# Patient Record
Sex: Male | Born: 1935 | Race: White | Hispanic: No | Marital: Married
Health system: Southern US, Community
[De-identification: ages and names within clinical notes are randomized; demographics above are authoritative.]

---

## 2007-01-16 ENCOUNTER — Telehealth: Payer: Self-pay | Admitting: Infectious Disease

## 2007-01-21 ENCOUNTER — Ambulatory Visit (HOSPITAL_COMMUNITY): Admission: RE | Admit: 2007-01-21 | Discharge: 2007-01-21 | Payer: Self-pay | Admitting: Infectious Disease

## 2007-01-21 ENCOUNTER — Ambulatory Visit: Payer: Self-pay | Admitting: Infectious Disease

## 2007-01-21 DIAGNOSIS — IMO0001 Reserved for inherently not codable concepts without codable children: Secondary | ICD-10-CM

## 2007-01-21 DIAGNOSIS — I4891 Unspecified atrial fibrillation: Secondary | ICD-10-CM

## 2007-01-21 DIAGNOSIS — B957 Other staphylococcus as the cause of diseases classified elsewhere: Secondary | ICD-10-CM

## 2007-01-21 DIAGNOSIS — I509 Heart failure, unspecified: Secondary | ICD-10-CM | POA: Insufficient documentation

## 2007-01-21 DIAGNOSIS — Z951 Presence of aortocoronary bypass graft: Secondary | ICD-10-CM

## 2007-01-21 DIAGNOSIS — R5383 Other fatigue: Secondary | ICD-10-CM

## 2007-01-21 DIAGNOSIS — G909 Disorder of the autonomic nervous system, unspecified: Secondary | ICD-10-CM | POA: Insufficient documentation

## 2007-01-21 DIAGNOSIS — R5381 Other malaise: Secondary | ICD-10-CM

## 2007-01-21 DIAGNOSIS — Z950 Presence of cardiac pacemaker: Secondary | ICD-10-CM

## 2007-01-21 LAB — CONVERTED CEMR LAB
ALT: 28 units/L (ref 0–53)
AST: 23 units/L (ref 0–37)
Albumin ELP: 58.6 % (ref 55.8–66.1)
Albumin: 4.3 g/dL (ref 3.5–5.2)
Aldolase: 5.5 units/L (ref <=8.1)
Alkaline Phosphatase: 83 units/L (ref 39–117)
Alpha-1-Globulin: 4.6 % (ref 2.9–4.9)
Alpha-2-Globulin: 11 % (ref 7.1–11.8)
BUN: 20 mg/dL (ref 6–23)
Beta Globulin: 6.3 % (ref 4.7–7.2)
Bilirubin Urine: NEGATIVE
CMV IgM: <0.9 (ref <0.90)
CO2: 23 meq/L (ref 19–32)
CRP: 0.6 mg/dL — ABNORMAL HIGH (ref <0.6)
Calcium: 10.1 mg/dL (ref 8.4–10.5)
Chloride: 105 meq/L (ref 96–112)
Creatinine, Ser: 1.03 mg/dL (ref 0.40–1.50)
Cytomegalovirus Ab-IgG: POSITIVE — AB
EBV NA IgG: 4.97 — ABNORMAL HIGH
EBV VCA IgG: 4.82 — ABNORMAL HIGH
EBV VCA IgM: 0.17
Ferritin: 306 ng/mL (ref 22–322)
Free T4: 1.1 ng/dL (ref 0.89–1.80)
Gamma Globulin: 14.7 % (ref 11.1–18.8)
Glucose, Bld: 92 mg/dL (ref 70–99)
HCV Ab: NEGATIVE
Hemoglobin, Urine: NEGATIVE
Hep A Total Ab: POSITIVE — AB
Hep B S Ab: NEGATIVE
Hepatitis B Surface Ag: NEGATIVE
Hgb A1c MFr Bld: 5.5 %
Iron: 61 ug/dL (ref 42–165)
Ketones, ur: NEGATIVE mg/dL
LDH: 170 units/L (ref 94–250)
Leukocytes, UA: NEGATIVE
Neutrophil cytoplasmic antibodies,IgG,serum: <1:20 {titer}
Nitrite: NEGATIVE
PSA: 0.67 ng/mL (ref 0.10–4.00)
Potassium: 4.2 meq/L (ref 3.5–5.3)
Pro B Natriuretic peptide (BNP): <30 pg/mL (ref 0.0–100.0)
Pro B Natriuretic peptide (BNP): 26 pg/mL (ref 0.0–100.0)
Protein, ur: NEGATIVE mg/dL
RBC Folate: 568 ng/mL (ref 180–600)
Saturation Ratios: 21 % (ref 20–55)
Sed Rate: 20 mm/hr — ABNORMAL HIGH (ref 0–16)
Sed Rate: 20 mm/hr — ABNORMAL HIGH (ref 0–16)
Sodium: 140 meq/L (ref 135–145)
Specific Gravity, Urine: 1.025 (ref 1.005–1.03)
T3, Total: 140.1 ng/dL (ref 80.0–204.0)
TIBC: 290 ug/dL (ref 215–435)
TSH: 2.118 microintl units/mL (ref 0.350–5.50)
Total Bilirubin: 0.4 mg/dL (ref 0.3–1.2)
Total CK: 100 units/L (ref 7–232)
Total Protein, Serum Electrophoresis: 7.2 g/dL (ref 6.0–8.3)
Total Protein: 7.2 g/dL (ref 6.0–8.3)
UIBC: 229 ug/dL
Uric Acid, Serum: 7 mg/dL (ref 2.4–7.0)
Urine Glucose: NEGATIVE mg/dL
Urobilinogen, UA: 0.2 (ref 0.0–1.0)
Vitamin B-12: 436 pg/mL (ref 211–911)
pH: 5.5 (ref 5.0–8.0)

## 2007-01-22 ENCOUNTER — Encounter: Payer: Self-pay | Admitting: Infectious Disease

## 2007-01-24 ENCOUNTER — Encounter: Payer: Self-pay | Admitting: Infectious Disease

## 2007-01-24 ENCOUNTER — Telehealth: Payer: Self-pay | Admitting: Infectious Disease

## 2007-01-24 ENCOUNTER — Telehealth: Payer: Self-pay

## 2007-01-29 ENCOUNTER — Encounter: Payer: Self-pay | Admitting: *Deleted

## 2007-01-29 ENCOUNTER — Encounter: Payer: Self-pay | Admitting: Infectious Disease

## 2007-01-29 ENCOUNTER — Ambulatory Visit (HOSPITAL_COMMUNITY): Admission: RE | Admit: 2007-01-29 | Discharge: 2007-01-29 | Payer: Self-pay | Admitting: Infectious Disease

## 2007-01-29 DIAGNOSIS — Z8669 Personal history of other diseases of the nervous system and sense organs: Secondary | ICD-10-CM | POA: Insufficient documentation

## 2007-02-04 ENCOUNTER — Telehealth (INDEPENDENT_AMBULATORY_CARE_PROVIDER_SITE_OTHER): Payer: Self-pay | Admitting: *Deleted

## 2007-02-21 ENCOUNTER — Ambulatory Visit (HOSPITAL_COMMUNITY): Admission: RE | Admit: 2007-02-21 | Discharge: 2007-02-21 | Payer: Self-pay | Admitting: Neurology

## 2007-02-28 ENCOUNTER — Encounter: Payer: Self-pay | Admitting: Infectious Disease

## 2007-03-11 ENCOUNTER — Ambulatory Visit: Payer: Self-pay | Admitting: Infectious Disease

## 2007-03-11 DIAGNOSIS — L272 Dermatitis due to ingested food: Secondary | ICD-10-CM

## 2007-03-11 DIAGNOSIS — J309 Allergic rhinitis, unspecified: Secondary | ICD-10-CM | POA: Insufficient documentation

## 2009-01-06 ENCOUNTER — Encounter (HOSPITAL_COMMUNITY): Admission: RE | Admit: 2009-01-06 | Discharge: 2009-02-05 | Payer: Self-pay

## 2009-01-27 ENCOUNTER — Ambulatory Visit (HOSPITAL_COMMUNITY): Admission: RE | Admit: 2009-01-27 | Discharge: 2009-01-27 | Payer: Self-pay | Admitting: Hematology

## 2009-11-10 ENCOUNTER — Ambulatory Visit (HOSPITAL_COMMUNITY): Admission: RE | Admit: 2009-11-10 | Discharge: 2009-11-10 | Payer: Self-pay | Admitting: Hematology

## 2009-11-15 ENCOUNTER — Ambulatory Visit (HOSPITAL_COMMUNITY): Admission: RE | Admit: 2009-11-15 | Discharge: 2009-11-15 | Payer: Self-pay | Admitting: *Deleted

## 2010-04-21 LAB — CBC
HCT: 45.6 % (ref 39.0–52.0)
Hemoglobin: 15.5 g/dL (ref 13.0–17.0)
MCH: 29.9 pg (ref 26.0–34.0)
MCHC: 34.1 g/dL (ref 30.0–36.0)
MCV: 87.6 fL (ref 78.0–100.0)
Platelets: 180 10*3/uL (ref 150–400)
RBC: 5.2 MIL/uL (ref 4.22–5.81)
RDW: 16 % — ABNORMAL HIGH (ref 11.5–15.5)
WBC: 11.2 10*3/uL — ABNORMAL HIGH (ref 4.0–10.5)

## 2010-04-21 LAB — APTT: aPTT: 33 seconds (ref 24–37)

## 2010-04-21 LAB — PROTIME-INR
INR: 1.06 (ref 0.00–1.49)
Prothrombin Time: 14 seconds (ref 11.6–15.2)

## 2010-04-21 LAB — GLUCOSE, CAPILLARY: Glucose-Capillary: 85 mg/dL (ref 70–99)

## 2010-07-14 ENCOUNTER — Other Ambulatory Visit: Payer: Self-pay | Admitting: Dermatology

## 2010-08-15 ENCOUNTER — Other Ambulatory Visit: Payer: Self-pay | Admitting: Dermatology

## 2010-12-02 ENCOUNTER — Other Ambulatory Visit: Payer: Self-pay | Admitting: Dermatology

## 2011-07-13 ENCOUNTER — Other Ambulatory Visit: Payer: Self-pay | Admitting: Dermatology

## 2011-09-28 IMAGING — CT CT BIOPSY
2 of 5 series · 13 of 32 positions shown, 19 images · non-contrast
Comparison: none

CLINICAL DATA: [place] cell tumor.  Persistent iliac adenopathy.

[Series 2: rtn ap without · axial · non-contrast · 0.74mm/px · z∈[-500,-410]mm · 10 of 23 slices shown, 16 images (1 of 2)]
[im 3/23  soft-tissue]
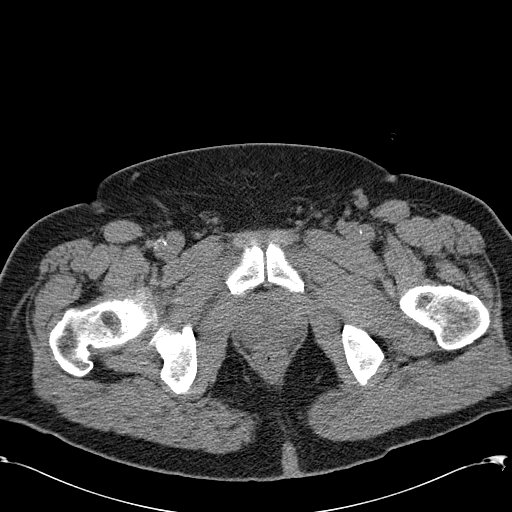
[im 3/23  bone]
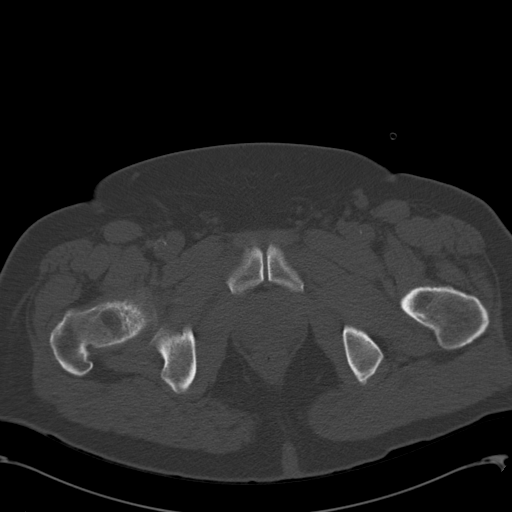
[im 5/23  soft-tissue]
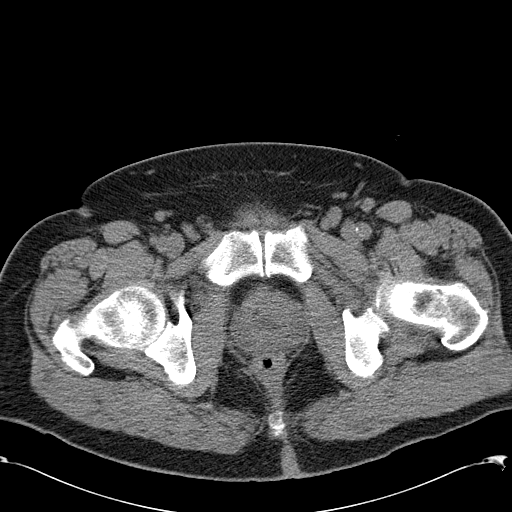
[im 7/23  soft-tissue]
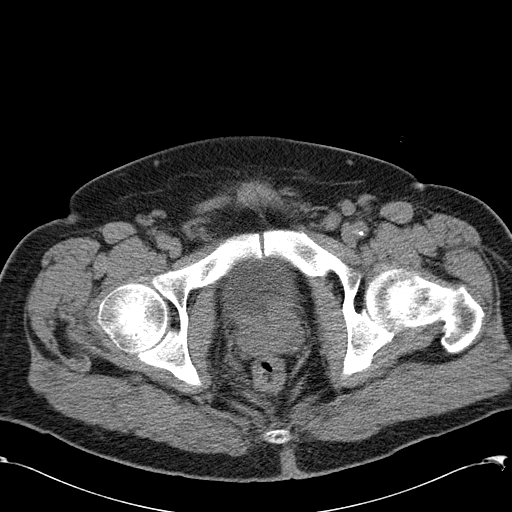
[im 9/23  soft-tissue]
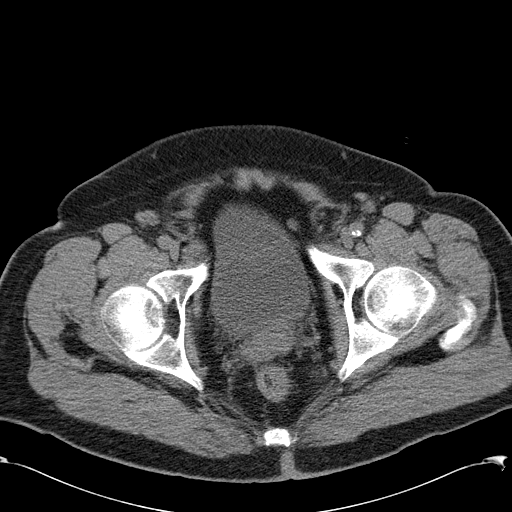
[im 11/23  soft-tissue]
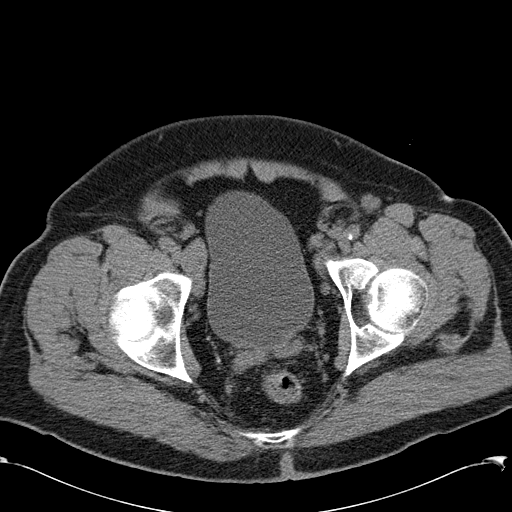
[im 13/23  soft-tissue]
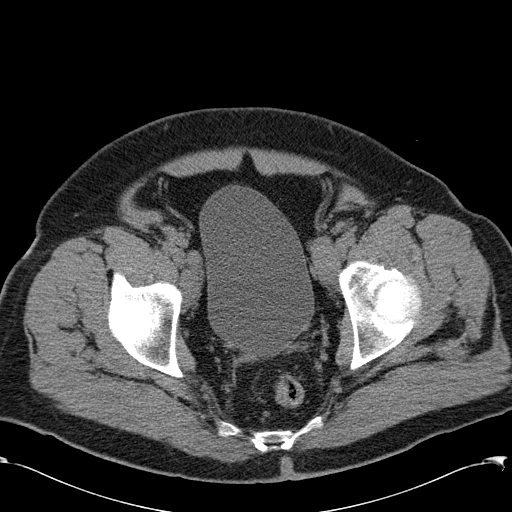
[im 15/23  soft-tissue]
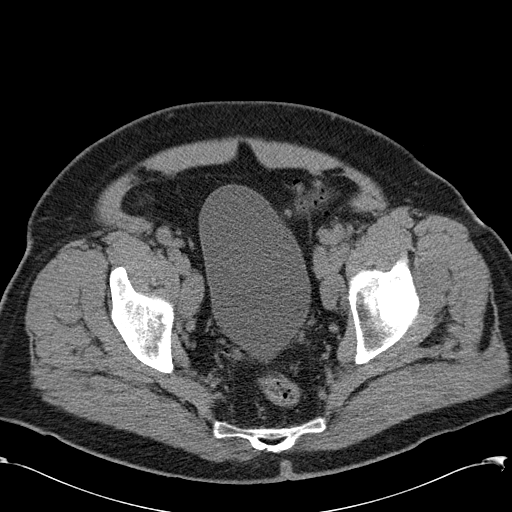
[im 15/23  lung]
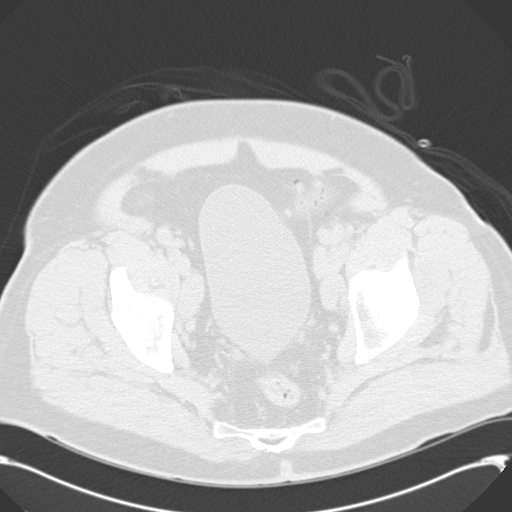
[im 17/23  soft-tissue]
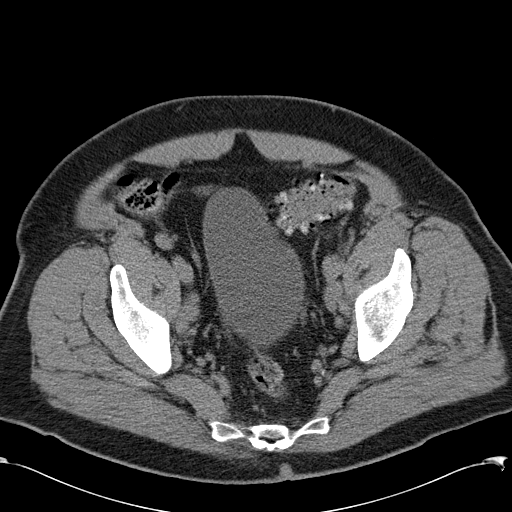
[im 17/23  lung]
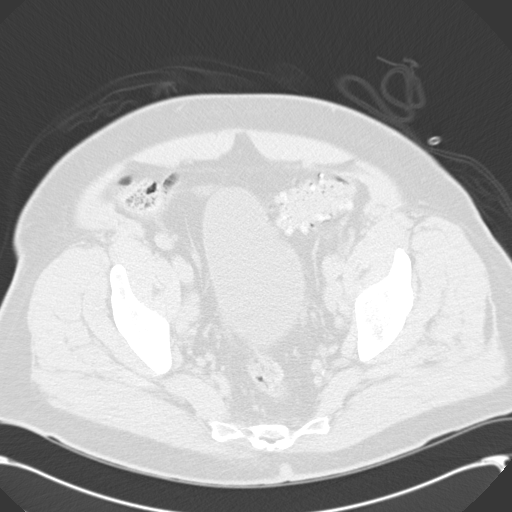
[im 19/23  soft-tissue]
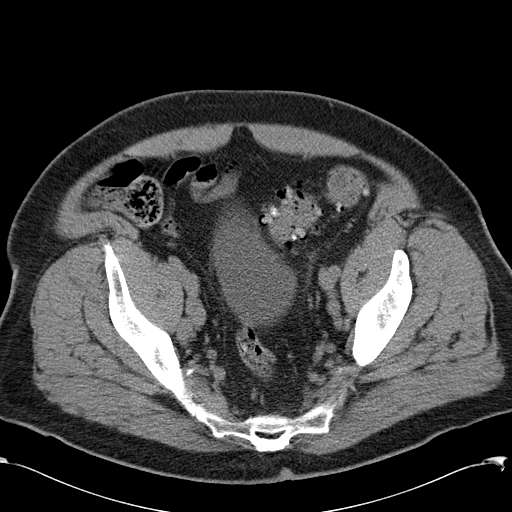
[im 19/23  lung]
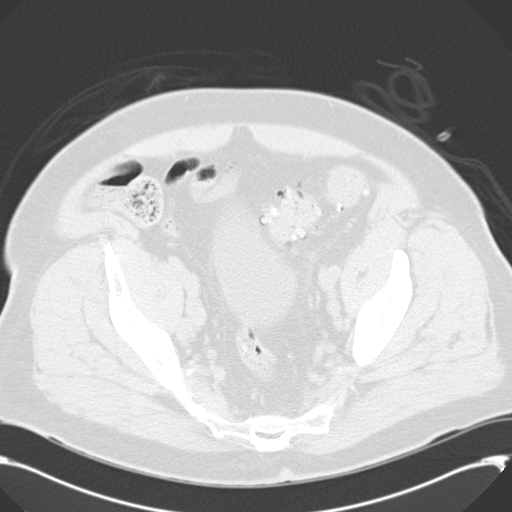
[im 19/23  bone]
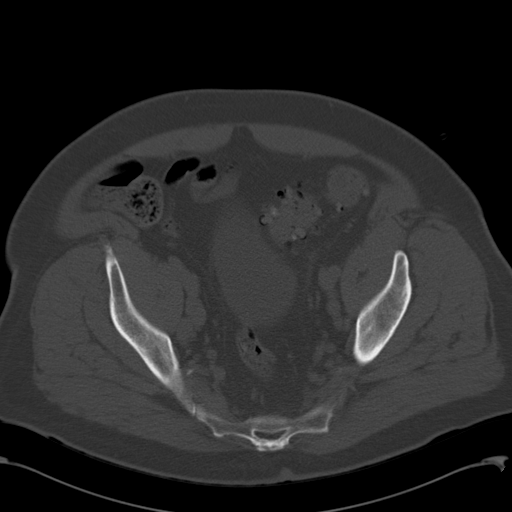
[im 21/23  soft-tissue]
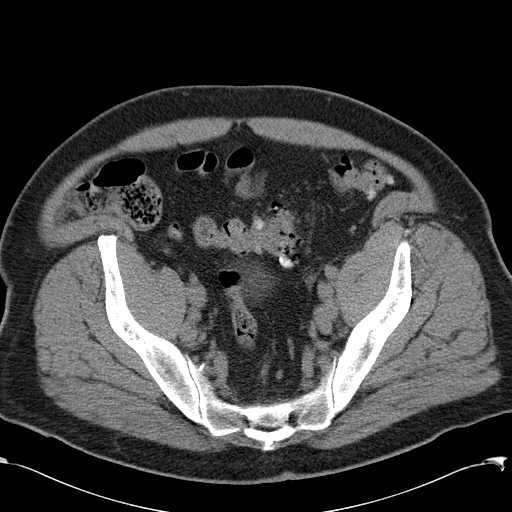
[im 21/23  lung]
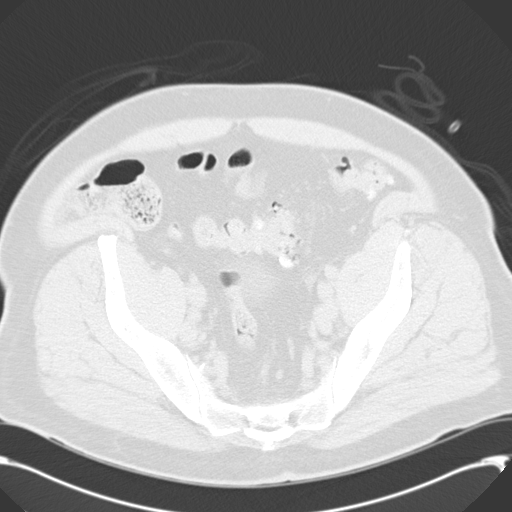

[Series 3: rtn ap without · axial · non-contrast · 0.74mm/px · z∈[-530,-506]mm · 3 of 14 slices shown (2 of 2)]
[im 3/14  soft-tissue]
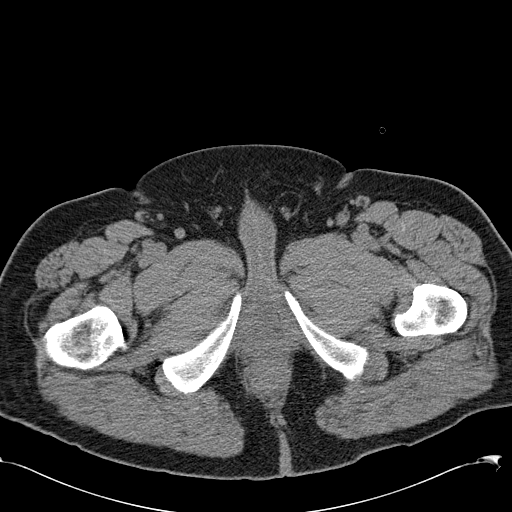
[im 6/14  soft-tissue]
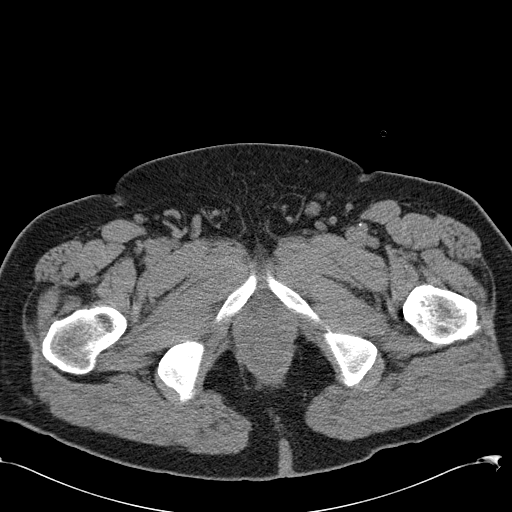
[im 8/14  soft-tissue]
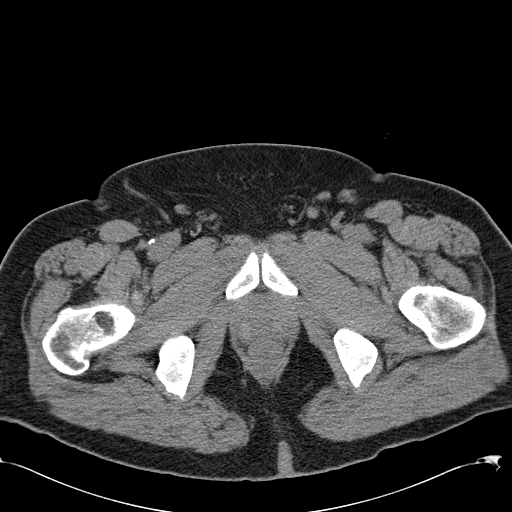

[13 of 32 positions shown; findings below may reference images not displayed]

CT-GUIDED CORE PELVIC BIOPSY

Technique and findings:  Select axial scans through the lower
pelvis were obtained and an appropriate skin entry site was
localized.  Site was marked, prepped with Betadine, draped in usual
sterile fashion, infiltrated locally with 1% lidocaine. Intravenous
Fentanyl and Versed were administered as conscious sedation during
continuous cardiorespiratory monitoring by the radiology RN, with a
total moderate sedation time of 10 minutes.
Under CT fluoroscopic guidance, a 17 gauge trocar needle was
advanced to the margin of the right external iliac adenopathy.
Once needle tip position was confirmed, coaxial 18-gauge core
biopsy samples were obtained.  An aspiration biopsy was also
requested, and performed using the 17 gauge guide which was then
removed.  Postprocedure scans show no hematoma or other apparent
complication. The patient tolerated the procedure well.

IMPRESSION
1.  Technically successful CT-guided core biopsy of right external
iliac adenopathy.

## 2012-05-01 ENCOUNTER — Other Ambulatory Visit: Payer: Self-pay | Admitting: Dermatology

## 2013-04-29 ENCOUNTER — Other Ambulatory Visit: Payer: Self-pay | Admitting: Dermatology

## 2013-05-07 ENCOUNTER — Encounter (HOSPITAL_COMMUNITY): Payer: Self-pay | Admitting: Pharmacy Technician

## 2013-05-07 ENCOUNTER — Other Ambulatory Visit (HOSPITAL_COMMUNITY): Payer: Self-pay | Admitting: Hematology

## 2013-05-07 DIAGNOSIS — C4A9 Merkel cell carcinoma, unspecified: Secondary | ICD-10-CM

## 2013-05-08 ENCOUNTER — Ambulatory Visit (HOSPITAL_COMMUNITY)
Admission: RE | Admit: 2013-05-08 | Discharge: 2013-05-08 | Disposition: A | Discharge status: Home / Self Care | Payer: Medicare Other | Source: Ambulatory Visit | Attending: Hematology | Admitting: Hematology

## 2013-05-08 ENCOUNTER — Other Ambulatory Visit (HOSPITAL_COMMUNITY): Payer: Self-pay | Admitting: Hematology

## 2013-05-08 DIAGNOSIS — C4A9 Merkel cell carcinoma, unspecified: Secondary | ICD-10-CM | POA: Insufficient documentation

## 2013-07-29 ENCOUNTER — Other Ambulatory Visit: Payer: Self-pay | Admitting: Dermatology

## 2014-05-27 ENCOUNTER — Other Ambulatory Visit: Payer: Self-pay | Admitting: Dermatology

## 2014-06-08 ENCOUNTER — Other Ambulatory Visit: Payer: Self-pay | Admitting: Dermatology

## 2014-07-08 ENCOUNTER — Other Ambulatory Visit (HOSPITAL_COMMUNITY): Payer: Self-pay | Admitting: Hematology

## 2014-07-08 DIAGNOSIS — Z85821 Personal history of Merkel cell carcinoma: Secondary | ICD-10-CM

## 2014-07-08 DIAGNOSIS — C8300 Small cell B-cell lymphoma, unspecified site: Secondary | ICD-10-CM

## 2014-07-22 ENCOUNTER — Ambulatory Visit (HOSPITAL_COMMUNITY)
Admission: RE | Admit: 2014-07-22 | Discharge: 2014-07-22 | Disposition: A | Discharge status: Home / Self Care | Payer: Medicare Other | Source: Ambulatory Visit | Attending: Hematology | Admitting: Hematology

## 2014-07-22 DIAGNOSIS — Z85821 Personal history of Merkel cell carcinoma: Secondary | ICD-10-CM

## 2014-07-22 DIAGNOSIS — Z79899 Other long term (current) drug therapy: Secondary | ICD-10-CM | POA: Diagnosis not present

## 2014-07-22 DIAGNOSIS — C8300 Small cell B-cell lymphoma, unspecified site: Secondary | ICD-10-CM | POA: Diagnosis not present

## 2014-07-22 LAB — GLUCOSE, CAPILLARY: GLUCOSE-CAPILLARY: 107 mg/dL — AB (ref 65–99)

## 2014-07-22 MED ORDER — FLUDEOXYGLUCOSE F - 18 (FDG) INJECTION
10.8000 | Freq: Once | INTRAVENOUS | Status: AC | PRN
Start: 2014-07-22 — End: 2014-07-22
  Administered 2014-07-22: 10.8 via INTRAVENOUS

## 2019-04-07 DEATH — deceased
